# Patient Record
Sex: Female | Born: 1945 | Race: Black or African American | Hispanic: No | Marital: Single | Smoking: Former smoker
Health system: Southern US, Community
[De-identification: ages and names within clinical notes are randomized; demographics above are authoritative.]

---

## 2017-12-25 ENCOUNTER — Emergency Department (HOSPITAL_COMMUNITY): Payer: Medicare Other

## 2017-12-25 ENCOUNTER — Inpatient Hospital Stay (HOSPITAL_COMMUNITY): Payer: Medicare Other

## 2017-12-25 ENCOUNTER — Encounter (HOSPITAL_COMMUNITY): Payer: Self-pay

## 2017-12-25 ENCOUNTER — Inpatient Hospital Stay (HOSPITAL_COMMUNITY)
Admission: EM | Admit: 2017-12-25 | Discharge: 2017-12-28 | DRG: 071 | Disposition: A | Discharge status: Home / Self Care | Payer: Medicare Other | Attending: Internal Medicine | Admitting: Internal Medicine

## 2017-12-25 DIAGNOSIS — N390 Urinary tract infection, site not specified: Secondary | ICD-10-CM | POA: Diagnosis present

## 2017-12-25 DIAGNOSIS — G9349 Other encephalopathy: Principal | ICD-10-CM | POA: Diagnosis present

## 2017-12-25 DIAGNOSIS — R4189 Other symptoms and signs involving cognitive functions and awareness: Secondary | ICD-10-CM | POA: Diagnosis present

## 2017-12-25 DIAGNOSIS — Z87891 Personal history of nicotine dependence: Secondary | ICD-10-CM

## 2017-12-25 DIAGNOSIS — E876 Hypokalemia: Secondary | ICD-10-CM | POA: Diagnosis present

## 2017-12-25 DIAGNOSIS — W19XXXA Unspecified fall, initial encounter: Secondary | ICD-10-CM | POA: Diagnosis present

## 2017-12-25 DIAGNOSIS — R410 Disorientation, unspecified: Secondary | ICD-10-CM

## 2017-12-25 DIAGNOSIS — Z9181 History of falling: Secondary | ICD-10-CM | POA: Diagnosis not present

## 2017-12-25 DIAGNOSIS — Z79891 Long term (current) use of opiate analgesic: Secondary | ICD-10-CM | POA: Diagnosis not present

## 2017-12-25 DIAGNOSIS — R51 Headache: Secondary | ICD-10-CM | POA: Diagnosis present

## 2017-12-25 DIAGNOSIS — G3184 Mild cognitive impairment, so stated: Secondary | ICD-10-CM | POA: Diagnosis not present

## 2017-12-25 DIAGNOSIS — Z791 Long term (current) use of non-steroidal anti-inflammatories (NSAID): Secondary | ICD-10-CM

## 2017-12-25 DIAGNOSIS — G8929 Other chronic pain: Secondary | ICD-10-CM | POA: Diagnosis present

## 2017-12-25 DIAGNOSIS — Z79899 Other long term (current) drug therapy: Secondary | ICD-10-CM

## 2017-12-25 DIAGNOSIS — M549 Dorsalgia, unspecified: Secondary | ICD-10-CM | POA: Diagnosis present

## 2017-12-25 DIAGNOSIS — G35 Multiple sclerosis: Secondary | ICD-10-CM | POA: Diagnosis present

## 2017-12-25 DIAGNOSIS — R4182 Altered mental status, unspecified: Secondary | ICD-10-CM | POA: Diagnosis present

## 2017-12-25 DIAGNOSIS — G35D Multiple sclerosis, unspecified: Secondary | ICD-10-CM | POA: Diagnosis present

## 2017-12-25 LAB — COMPREHENSIVE METABOLIC PANEL
ALT: 17 U/L (ref 14–54)
ANION GAP: 10 (ref 5–15)
AST: 18 U/L (ref 15–41)
Albumin: 4.3 g/dL (ref 3.5–5.0)
Alkaline Phosphatase: 44 U/L (ref 38–126)
BUN: 8 mg/dL (ref 6–20)
CO2: 24 mmol/L (ref 22–32)
Calcium: 9.1 mg/dL (ref 8.9–10.3)
Chloride: 100 mmol/L — ABNORMAL LOW (ref 101–111)
Creatinine, Ser: 0.76 mg/dL (ref 0.44–1.00)
GFR calc non Af Amer: >60 mL/min (ref >60)
Glucose, Bld: 107 mg/dL — ABNORMAL HIGH (ref 65–99)
Potassium: 3.3 mmol/L — ABNORMAL LOW (ref 3.5–5.1)
SODIUM: 134 mmol/L — AB (ref 135–145)
Total Bilirubin: 0.5 mg/dL (ref 0.3–1.2)
Total Protein: 6.9 g/dL (ref 6.5–8.1)

## 2017-12-25 LAB — CBC
HCT: 40.2 % (ref 36.0–46.0)
HEMOGLOBIN: 13.5 g/dL (ref 12.0–15.0)
MCH: 30 pg (ref 26.0–34.0)
MCHC: 33.6 g/dL (ref 30.0–36.0)
MCV: 89.3 fL (ref 78.0–100.0)
Platelets: 143 10*3/uL — ABNORMAL LOW (ref 150–400)
RBC: 4.5 MIL/uL (ref 3.87–5.11)
RDW: 14.1 % (ref 11.5–15.5)
WBC: 5.1 10*3/uL (ref 4.0–10.5)

## 2017-12-25 LAB — URINALYSIS, ROUTINE W REFLEX MICROSCOPIC
Bilirubin Urine: NEGATIVE
Glucose, UA: NEGATIVE mg/dL
Ketones, ur: 5 mg/dL — AB
Nitrite: NEGATIVE
Protein, ur: NEGATIVE mg/dL
SPECIFIC GRAVITY, URINE: 1.024 (ref 1.005–1.030)
pH: 5 (ref 5.0–8.0)

## 2017-12-25 LAB — CBG MONITORING, ED: GLUCOSE-CAPILLARY: 92 mg/dL (ref 65–99)

## 2017-12-25 MED ORDER — ENOXAPARIN SODIUM 40 MG/0.4ML ~~LOC~~ SOLN
40.0000 mg | SUBCUTANEOUS | Status: DC
  Administered 2017-12-25 – 2017-12-27 (×3): 40 mg via SUBCUTANEOUS
  Filled 2017-12-25 (×2): qty 0.4

## 2017-12-25 MED ORDER — POTASSIUM CHLORIDE CRYS ER 20 MEQ PO TBCR
40.0000 meq | EXTENDED_RELEASE_TABLET | Freq: Once | ORAL | Status: AC
  Administered 2017-12-25: 40 meq via ORAL
  Filled 2017-12-25: qty 2

## 2017-12-25 MED ORDER — GADOBENATE DIMEGLUMINE 529 MG/ML IV SOLN
10.0000 mL | Freq: Once | INTRAVENOUS | Status: AC
  Administered 2017-12-25: 10 mL via INTRAVENOUS

## 2017-12-25 MED ORDER — CEPHALEXIN 250 MG PO CAPS
500.0000 mg | ORAL_CAPSULE | Freq: Once | ORAL | Status: AC
  Administered 2017-12-25: 500 mg via ORAL
  Filled 2017-12-25: qty 2

## 2017-12-25 MED ORDER — SODIUM CHLORIDE 0.9 % IV SOLN
1000.0000 mg | Freq: Once | INTRAVENOUS | Status: DC
  Filled 2017-12-25: qty 8

## 2017-12-25 NOTE — ED Notes (Signed)
Pt ambulatory from ED lobby; gait steady. Pt given gown, dressing out at this time. Family at bedside.

## 2017-12-25 NOTE — ED Notes (Signed)
Patient transported to MRI 

## 2017-12-25 NOTE — ED Notes (Signed)
Patient transported to CT 

## 2017-12-25 NOTE — H&P (Signed)
Date: 12/25/2017               Patient Name:  Debra Beltran MRN: [id number]  DOB: August 07, 1946 Age / Sex: 72 y.o., female   PCP: Patient, No Pcp Per         Medical Service: Internal Medicine Teaching Service         Attending Physician: Dr. Sandre Kitty, Elwin Mocha, MD    First Contact: Dr. Alinda Money Pager: [phone]  Second Contact: Dr. Obie Dredge Pager: [phone]       After Hours (After 5p/  First Contact Pager: [phone]  weekends / holidays): Second Contact Pager: [phone]   Chief Complaint: Altered Mental Status, Fall  History of Present Illness: Debra Beltran is a 72 yo F with Hx of Multiple Sclerosis, Chronic Back Pain, and face pain who presents with recent altered mental status and fall. Patient experienced a fall 2 days ago and her family reports she has appeared to have had difficulty with her balance, possibly caused by her walking differently due to chronic back pain. The patient states that she supposes it is true, but she is not sure that she has noticed it herself. Her family additionally reports on going difficulty with word finding at times. Patient is visiting her family from [place]. From history given by family and other providers it was discovered that patient had been seen in [place] (on 12/12/2017) for aphasia (this difficulty finding her words) and her work up their included CBC/BMP without significant abnormality, UA with trace LE, CXR WNL, and MR Brain that showed no acute findings and white matter lesions compatible with her history of Debra. She was discharged with a neurology follow up. She then visited a friend in [place] who reportedly told the patient's daughter that the patient did not seem to be acting like her usual self. When the patient arrived to see her daughter here, the daughter noticed the difficulty finding words and confusion. Patient denies any previous history of falls. She denies any fevers, chills, cough, chest pain, shortness of breath, nauseam increased  urination, or dysuria.  In the ED, patient's vital signs were stable. CBC WNL; CMP showed K 3.3; U/A showed trace leukocytes and rare bacteria (0-5 Squam, suspect contamination). CT head was WNL. Patient received 500mg  PO Keflex. Neurology was consulted in the ED. Patient to be admitted for further workup and care.  Meds:  Current Meds  Medication Sig  . diclofenac (VOLTAREN) 75 MG EC tablet Take 75 mg by mouth daily as needed for pain.  Marland Kitchen gabapentin (NEURONTIN) 300 MG capsule Take 300 mg by mouth 3 (three) times daily.  . Oxcarbazepine (TRILEPTAL) 300 MG tablet Take 300 mg by mouth 2 (two) times daily.  Marland Kitchen oxyCODONE-acetaminophen (PERCOCET) 10-325 MG tablet Take 1 tablet by mouth every 4 (four) hours as needed for pain.     Allergies: Allergies as of 12/25/2017  . (No Known Allergies)   No past medical history on file.  Family History:  Family History  Problem Relation Age of Onset  . Diabetes Father   - Reviewed on admission  Social History:  Social History   Tobacco Use  . Smoking status: Former Smoker    Years: 1.00  . Smokeless tobacco: Former Neurosurgeon    Quit date: 12/26/1967  Substance Use Topics  . Alcohol use: Yes    Alcohol/week: 0.6 - 1.2 oz    Types: 1 - 2 Standard drinks or equivalent per week    Frequency:  Never  . Drug use: Never  - Reviewed on admission  Review of Systems: A complete ROS was negative except as per HPI.  Physical Exam: Blood pressure 133/78, pulse 76, temperature 98.5 F (36.9 C), temperature source Rectal, resp. rate 13, height 5\' 2"  (1.575 m), weight 110 lb (49.9 kg), SpO2 100 %. Physical Exam  Constitutional: She appears well-developed and well-nourished. No distress.  HENT:  Head: Normocephalic and atraumatic.  Cardiovascular: Normal rate, regular rhythm, normal heart sounds and intact distal pulses.  Pulmonary/Chest: Effort normal and breath sounds normal. No respiratory distress.  Abdominal: Soft. Bowel sounds are normal. She  exhibits no distension. There is no tenderness.  Musculoskeletal: She exhibits no edema or deformity.  Neurological: She is alert.  Either not oriented to time or having trouble expressing time Oriented to person, place, situation CN II-XII grossly intact Strength and Sensation grossly intact bilateral upper and lower extremities  Skin: Skin is warm and dry.  Psychiatric: She has a normal mood and affect.   Assessment & Plan by Problem: 72 yo F with Hx of Multiple Sclerosis, Chronic Back Pain, and face pain who presents with recent altered mental status and fall.  Altered mental status Recent Falls: Patient with a history of multiple sclerosis presenting with word finding difficulty and balance issues leading to a recent fall. Symptoms have been ongoing for just a few weeks per patient, however, her family feels they have noticed some change in the last couple of months. In this patient differential includes stroke (unlikely given normal MRI in [place]), Debra Flair (possible, though no acute changes on MRI in [place]); Dementia (patient appear to try to mask her deficits by stating things in different ways, she is also reportedly very private and independent, she may have been able to compensate for smaller deficits if they have been present longer than she states as she is very intelligent and well educated). Less likely would be infectious etiology (normal CXR in [place], no respiratory symptoms; UA showed leuks and bacteria here, but was likely contaminated sample; Afebrile; No Leukocytosis). > Will need to speak with patient further about any - Nuerology consulted in the ED, Appreciate their recommendations - MR Brain - Cognitive screen tomorrow  Hypokalemia: K 3.3 on presentation. - PO - BMP  Chronic Back Pain - Holding home Percocet, Gabapentin, and Voltaren  Facial Pain - Holfing home trileptal  FEN: Regular VTE ppx: Lovenox Code Status: FULL   Dispo: Admit patient to Inpatient  with expected length of stay greater than 2 midnights.  Signed: Beola Cord, MD 12/25/2017, 6:23 PM  Pager: [phone]

## 2017-12-25 NOTE — Consult Note (Addendum)
Neurology Consultation Reason for Consult: Confusion Referring Physician: Dr Rosalia Hammers  History is obtained from: patient, daughter and chart review  HPI: Debra Beltran is a 72 y.o. female with PMH of MS, who is brought by her daughter after falling 2 days and confusion noted by family members.  Patient was diagnosed MS several years ago, however has not had a flare for several years and not on disease modifying therapy.  Patient is a poor historian and denies any symptoms of MS, although daughter states she has trouble with balance.  Patient who lives in [place], was seen by a neurologist recently for aphasia and workup was apparently negative for stroke and demonstrated old MS lesions. Since last few days, family has noted she has been having difficulty speaking on the phone and told her daughter who visited and noticed her mom was not right and brought her to the hospital.  Patient denies any symptoms, however agreed that she had difficulty naming people/objects which is new to her. She is otherwise high functioning and works as a Clinical research associate.     ROS: A 14 point ROS was performed and is negative except as noted in the HPI.   Family History  Problem Relation Age of Onset  . Diabetes Father      Social History:  reports that she has quit smoking. She quit after 1.00 year of use. She quit smokeless tobacco use about 50 years ago. She reports that she drinks about 0.6 - 1.2 oz of alcohol per week. She reports that she does not use drugs.   Exam: Current vital signs: BP 108/61 (BP Location: Right Arm)   Pulse 85   Temp 98.5 F (36.9 C) (Rectal)   Resp 18   Ht 5\' 2"  (1.575 m)   Wt 49.9 kg (110 lb)   SpO2 98%   BMI 20.12 kg/m  Vital signs in last 24 hours: Temp:  [97.8 F (36.6 C)-98.5 F (36.9 C)] 98.5 F (36.9 C) (04/22 1235) Pulse Rate:  [76-90] 85 (04/22 2100) Resp:  [10-19] 18 (04/22 2100) BP: (108-152)/(50-88) 108/61 (04/22 2100) SpO2:  [97 %-100 %] 98 % (04/22 2100) Weight:   [49.9 kg (110 lb)] 49.9 kg (110 lb) (04/22 0824)   Physical Exam  Constitutional: Appears well-developed and well-nourished.  Psych: Affect appropriate to situation Eyes: No scleral injection HENT: No OP obstrucion Head: Normocephalic.  Cardiovascular: Normal rate and regular rhythm.  Respiratory: Effort normal, non-labored breathing GI: Soft.  No distension. There is no tenderness.  Skin: WDI  Neuro: Mental Status: Patient is awake, alert, oriented to person, place, month, year, and situation. Unable to name president, parts of body etc.  Follows commands Cranial Nerves: II: Visual Fields are full. Pupils are equal, round, and reactive to light.  III,IV, VI: EOMI without ptosis or diploplia.  V: Facial sensation is symmetric to temperature VII: Facial movement is symmetric.  VIII: hearing is intact to voice X: Uvula elevates symmetrically XI: Shoulder shrug is symmetric. XII: tongue is midline without atrophy or fasciculations.  Motor: Tone is normal. Bulk is normal. 5/5 strength was present in all four extremities.  Sensory: Sensation is symmetric to light touch and temperature in the arms and legs. Deep Tendon Reflexes: 2+ and symmetric in the biceps and patellae.  Plantars: Toes are downgoing bilaterally.  Cerebellar: FNF and HKS are intact bilaterally      I have reviewed labs in epic and the results pertinent to this consultation are: UA postive for leukocytes, rare bacteria  I have reviewed the images obtained:CT head   ASSESSMENT AND PLAN  48 y F with PMH of MS presents with confusion, nominal aphasia. No other localizing deficits. It would be unusual for her to have MS exacerbation at this age. Will need to r/o stroke, however given recent problems with word finding difficulty, less suspicion fort stroke.    Encephalopathy due to UTI MS dementia ?  Rule out CVA  Plan: MRI brain w/wo contrast to r/o stroke, active MS lesions Treat urinary tract  infection Hold off starting steroids at this time      Georgiana Spinner Shawana Knoch MD [facility] [phone]   If 7pm to 7am, please call on call as listed on AMION.

## 2017-12-25 NOTE — ED Triage Notes (Signed)
PT presents to ED with daughters from home. PT is visiting from [place] and family is concerned that pt has AMS, frequent falls. PT not able to tell this RN the month or year. PT family reports hx of MS

## 2017-12-25 NOTE — ED Notes (Signed)
Pt eating dinner tray provided; family at bedside.

## 2017-12-25 NOTE — ED Provider Notes (Signed)
[facility] EMERGENCY DEPARTMENT Provider Note   CSN: [id number] Arrival date & time: 12/25/17  0802     History   Chief Complaint Chief Complaint  Patient presents with  . Altered Mental Status    HPI Debra Beltran is a 72 y.o. female.  HPI  This is a 72 year old female who presents today with her daughter and daughter-in-law with increasing confusion.  The patient lives in [place] normally independently.  However she has paperwork from workup in [place] where she has had recent workup for aphasia.  She is scheduled to follow-up there with neurology does not appear to have followed up.  During that visit she had an MRI of her brain showed no acute MRI findings.  She does have a history of multiple sclerosis and white matter lesions compatible with her history of multiple sclerosis  No past medical history on file.  There are no active problems to display for this patient.    OB History   None      Home Medications    Prior to Admission medications   Medication Sig Start Date End Date Taking? Authorizing Provider  diclofenac (VOLTAREN) 75 MG EC tablet Take 75 mg by mouth daily as needed for pain. 10/23/17  Yes [provider]  gabapentin (NEURONTIN) 300 MG capsule Take 300 mg by mouth 3 (three) times daily. 11/22/17  Yes [provider]  Oxcarbazepine (TRILEPTAL) 300 MG tablet Take 300 mg by mouth 2 (two) times daily. 10/23/17  Yes [provider]  oxyCODONE-acetaminophen (PERCOCET) 10-325 MG tablet Take 1 tablet by mouth every 4 (four) hours as needed for pain. 10/24/17  Yes [provider]    Family History No family history on file.  Social History Social History   Tobacco Use  . Smoking status: Never Smoker  . Smokeless tobacco: Never Used  Substance Use Topics  . Alcohol use: Never    Frequency: Never  . Drug use: Never     Allergies   Patient has no known allergies.   Review of  Systems Review of Systems   Physical Exam Updated Vital Signs BP 138/73   Pulse 79   Temp 98.5 F (36.9 C) (Rectal)   Resp 13   Ht 1.575 m (5\' 2" )   Wt 49.9 kg (110 lb)   SpO2 100%   BMI 20.12 kg/m   Physical Exam   ED Treatments / Results  Labs (all labs ordered are listed, but only abnormal results are displayed) Labs Reviewed  COMPREHENSIVE METABOLIC PANEL - Abnormal; Notable for the following components:      Result Value   Sodium 134 (*)    Potassium 3.3 (*)    Chloride 100 (*)    Glucose, Bld 107 (*)    All other components within normal limits  CBC - Abnormal; Notable for the following components:   Platelets 143 (*)    All other components within normal limits  URINALYSIS, ROUTINE W REFLEX MICROSCOPIC - Abnormal; Notable for the following components:   APPearance HAZY (*)    Hgb urine dipstick SMALL (*)    Ketones, ur 5 (*)    Leukocytes, UA TRACE (*)    Bacteria, UA RARE (*)    Squamous Epithelial / LPF 0-5 (*)    All other components within normal limits  URINE CULTURE  CBG MONITORING, ED    EKG None  Radiology Ct Head Wo Contrast  Result Date: 12/25/2017 CLINICAL DATA:  Altered level of  consciousness. History of multiple sclerosis. EXAM: CT HEAD WITHOUT CONTRAST TECHNIQUE: Contiguous axial images were obtained from the base of the skull through the vertex without intravenous contrast. COMPARISON:  None. FINDINGS: Brain: No evidence of acute infarction, hemorrhage, hydrocephalus, extra-axial collection or mass lesion/mass effect. Mild to moderate patchy low-density in the cerebral white matter this patient with history of multiple sclerosis. Mild for age cerebral volume loss. Vascular: Atherosclerotic calcification is mild. No hyperdense vessel. Skull: No acute or aggressive finding. Sinuses/Orbits: Negative IMPRESSION: No acute finding. Electronically Signed   By: Marnee Spring M.D.   On: 12/25/2017 14:02    Procedures Procedures (including  critical care time)  Medications Ordered in ED Medications  cephALEXin (KEFLEX) capsule 500 mg (500 mg Oral Given 12/25/17 1450)     Initial Impression / Assessment and Plan / ED Course  I have reviewed the triage vital signs and the nursing notes.  Pertinent labs & imaging results that were available during my care of the patient were reviewed by me and considered in my medical decision making (see chart for details).    72 year old female with a history of MS with increasing confusion and appears to be subacute in nature.  Per daughter, independently in [place] and has continued to Audiological scientist.  However, she has had increased confusion and reported gait abnormality.  Patient has possible urinary tract infection here on labs and was given Keflex there is no focal neurological deficit noted here.  However, she is confused about dates and times repeating her birthdate when asked for the current date and year.  I discussed patient's care with neurology, Dr. Oliva Bustard, who advises that we get today's patient Solu-Medrol given her history of MS. reviewed records brought from visit to ED in [place] on 12/12/2017.  Discharge diagnosis at that time was aphasia.  She had MRI performed that showed no acute lesions but white matter lesions compatible with her history of multiple sclerosis Plan and need for ongoing evaluation and treatment discussed with patient, daughter, and patient's brother. Patient admitted as unassigned medicine patient to internal medicine teaching service.   Final Clinical Impressions(s) / ED Diagnoses   Final diagnoses:  Confusion    ED Discharge Orders    None       Margarita Grizzle, MD 12/25/17 1943

## 2017-12-25 NOTE — ED Notes (Signed)
ORDERED DIET FOR PT  

## 2017-12-26 ENCOUNTER — Other Ambulatory Visit: Payer: Self-pay

## 2017-12-26 DIAGNOSIS — R51 Headache: Secondary | ICD-10-CM

## 2017-12-26 DIAGNOSIS — G35 Multiple sclerosis: Secondary | ICD-10-CM | POA: Diagnosis present

## 2017-12-26 DIAGNOSIS — G8929 Other chronic pain: Secondary | ICD-10-CM

## 2017-12-26 DIAGNOSIS — Z87891 Personal history of nicotine dependence: Secondary | ICD-10-CM

## 2017-12-26 DIAGNOSIS — Z9181 History of falling: Secondary | ICD-10-CM

## 2017-12-26 DIAGNOSIS — R4189 Other symptoms and signs involving cognitive functions and awareness: Secondary | ICD-10-CM | POA: Diagnosis present

## 2017-12-26 DIAGNOSIS — E876 Hypokalemia: Secondary | ICD-10-CM

## 2017-12-26 DIAGNOSIS — Z79891 Long term (current) use of opiate analgesic: Secondary | ICD-10-CM

## 2017-12-26 DIAGNOSIS — Z79899 Other long term (current) drug therapy: Secondary | ICD-10-CM

## 2017-12-26 DIAGNOSIS — M549 Dorsalgia, unspecified: Secondary | ICD-10-CM

## 2017-12-26 DIAGNOSIS — R4182 Altered mental status, unspecified: Secondary | ICD-10-CM

## 2017-12-26 LAB — BASIC METABOLIC PANEL
Anion gap: 6 (ref 5–15)
BUN: 13 mg/dL (ref 6–20)
CHLORIDE: 108 mmol/L (ref 101–111)
CO2: 25 mmol/L (ref 22–32)
Calcium: 9 mg/dL (ref 8.9–10.3)
Creatinine, Ser: 0.83 mg/dL (ref 0.44–1.00)
GFR calc Af Amer: >60 mL/min (ref >60)
GFR calc non Af Amer: >60 mL/min (ref >60)
Glucose, Bld: 92 mg/dL (ref 65–99)
Potassium: 4.1 mmol/L (ref 3.5–5.1)
Sodium: 139 mmol/L (ref 135–145)

## 2017-12-26 LAB — VITAMIN B12: Vitamin B-12: 344 pg/mL (ref 180–914)

## 2017-12-26 LAB — URINE CULTURE: CULTURE: NO GROWTH

## 2017-12-26 LAB — HIV ANTIBODY (ROUTINE TESTING W REFLEX): HIV Screen 4th Generation wRfx: NONREACTIVE

## 2017-12-26 LAB — TSH: TSH: 0.929 u[IU]/mL (ref 0.350–4.500)

## 2017-12-26 MED ORDER — VITAMIN B-1 100 MG PO TABS
100.0000 mg | ORAL_TABLET | Freq: Every day | ORAL | Status: DC
  Administered 2017-12-26 – 2017-12-28 (×3): 100 mg via ORAL
  Filled 2017-12-26 (×3): qty 1

## 2017-12-26 NOTE — Progress Notes (Signed)
   Subjective: Debra Beltran was seen resting comfortable in her bed this morning. She denies any pain or discomfort today and states that she is overall feeling well. She continues to have difficulty with her memory and word finding.  She requests copes of her results before leaving. No complaints or questions this morning.  Objective:  Vital signs in last 24 hours: Vitals:   12/25/17 2230 12/25/17 2306 12/26/17 0008 12/26/17 0501  BP: 109/61 99/79 112/70 (!) 100/58  Pulse: 83 94 79 79  Resp: 18 19 18 18   Temp:   98.2 F (36.8 C) 98.1 F (36.7 C)  TempSrc:   Oral Oral  SpO2: 96% 100% 98% 98%  Weight:      Height:       Physical Exam  Constitutional: She appears well-developed and well-nourished. No distress.  HENT:  Head: Normocephalic and atraumatic.  Cardiovascular: Normal rate, regular rhythm, normal heart sounds and intact distal pulses.  Pulmonary/Chest: Effort normal and breath sounds normal. No respiratory distress.  Abdominal: Soft. Bowel sounds are normal. She exhibits no distension.  Musculoskeletal: She exhibits no edema or deformity.  Neurological: She is alert. No cranial nerve deficit.  Not oriented to time Oriented to person, place, situation CN II-XII grossly intact Strength and Sensation grossly intact bilateral upper and lower extremities  No disdiodochokinesia Grossly normal gait with mild-moderate balance disturbance  Skin: Skin is warm and dry.   Assessment/Plan:  Altered mental status Recent Falls: Patient with a history of multiple sclerosis presented with word finding difficulty, confusion, and balance issues leading to a recent fall. Her symptoms had been ongoing for at least a couple of months per her brother and the patient admitted to significant symptoms for the past few weeks (the patients friend in [place] had her go to the hospital a few weeks ago out of concern). > CT and Mri were negative for acute findings of CVA, Debra, or Debra Beltran. No hydrocephalus.  Mild cerebral atrophy. Also unlikely is infectious etiology (normal CXR in [place], no respiratory symptoms; UA showed leuks and bacteria here, but was likely contaminated sample; Afebrile; No Leukocytosis) > Patients history suspicious for dementia, [facility] 11/30 today. Suspect patient has been able to compensate for any deficits well until recently. When speaking with patient and family it is apparent that patient is very independent and private. She has been working independency for the past 7 or so year and this may have helped any early symptoms go unnoticed. - Nuerology consulted, Appreciate their recommendations - Will ask patient for permission to contact friend/coworker and PCP in [place] to better evaluate symptom onset - Will need to work carefully with patient and her family to arrange safe disposition considering significant deficits on MOCA and Exam  Hypokalemia: K 3.3 on presentation. - Resolved, 4.2 today  Chronic Back Pain - Holding home Percocet, Gabapentin, and Voltaren  Facial Pain - Holfing home trileptal  FEN: Regular VTE ppx: Lovenox Code Status: FULL   Dispo: Anticipated discharge in approximately 1-2 day(s).   Debra Cord, MD 12/26/2017, 3:58 PM Pager: [phone]

## 2017-12-26 NOTE — Consult Note (Cosign Needed)
History obtained from patient and chart review.  HPI: 72 y/o female with PMH of MS. Came to the hospital for confusion after falling for 2 days.  Patient denies symptoms but states " that is not good" when she could not recall numbers during [facility] exam.  Exam: Vitals:   12/26/17 0008 12/26/17 0501  BP: 112/70 (!) 100/58  Pulse: 79 79  Resp: 18 18  Temp: 98.2 F (36.8 C) 98.1 F (36.7 C)  SpO2: 98% 98%    Physical Exam   HEENT-  Normocephalic, no lesions, without obvious abnormality.  Normal external eye and conjunctiva.   Cardiovascular- S1-S2 audible, pulses palpable throughout   Lungs-no rhonchi or wheezing noted, no excessive working breathing.  Saturations within normal limits Abdomen- All 4 quadrants palpated and nontender Extremities- Warm, dry and intact Musculoskeletal-no joint tenderness, deformity or swelling Skin-warm and dry, no hyperpigmentation, vitiligo, or suspicious lesions    Neuro:  Mental Status: Alert, oriented, thought content appropriate.  Speech fluent without evidence of aphasia.  Able to follow 3 step commands without difficulty. Cranial Nerves: II:  Visual fields grossly normal,  III,IV, VI: ptosis not present, extra-ocular motions intact bilaterally pupils equal, round, reactive to light and accommodation V,VII: smile symmetric, facial light touch sensation normal bilaterally VIII: hearing normal bilaterally IX,X: uvula rises symmetrically XI: bilateral shoulder shrug XII: midline tongue extension Motor: Right : Upper extremity   4/5    Left:     Upper extremity   4/5  Lower extremity   4/5     Lower extremity   4/5 Tone and bulk:normal tone throughout; no atrophy noted Sensory: light touch intact throughout, bilaterally Deep Tendon Reflexes: 2+ and symmetric throughout Cerebellar: normal finger-to-nose,  and normal heel-to-shin test Gait: did not assess Adjusted MOCA: 2/8    Medications:    Pertinent Labs/Diagnostics:   Ct Head Wo  Contrast  Result Date: 12/25/2017 CLINICAL DATA:  Altered level of consciousness. History of multiple sclerosis. EXAM: CT HEAD WITHOUT CONTRAST TECHNIQUE: Contiguous axial images were obtained from the base of the skull through the vertex without intravenous contrast. COMPARISON:  None. FINDINGS: Brain: No evidence of acute infarction, hemorrhage, hydrocephalus, extra-axial collection or mass lesion/mass effect. Mild to moderate patchy low-density in the cerebral white matter this patient with history of multiple sclerosis. Mild for age cerebral volume loss. Vascular: Atherosclerotic calcification is mild. No hyperdense vessel. Skull: No acute or aggressive finding. Sinuses/Orbits: Negative IMPRESSION: No acute finding. Electronically Signed   By: Marnee Spring M.D.   On: 12/25/2017 14:02   Mr Laqueta Jean NI Contrast  Result Date: 12/25/2017 CLINICAL DATA:  Initial evaluation for acute altered mental status, history of MS. EXAM: MRI HEAD WITHOUT AND WITH CONTRAST TECHNIQUE: Multiplanar, multiecho pulse sequences of the brain and surrounding structures were obtained without and with intravenous contrast. CONTRAST:  63mL MULTIHANCE GADOBENATE DIMEGLUMINE 529 MG/ML IV SOLN COMPARISON:  Prior CT from earlier the same day. FINDINGS: Brain: Generalized age related cerebral atrophy. Patchy and confluent T2/FLAIR hyperintensity within the periventricular white matter, most like related to provided history of multiple sclerosis. Superimposed chronic T1 hypo intensities, consistent with chronic demyelination. No restricted diffusion or abnormal enhancement to suggest active demyelination. No evidence for acute or subacute ischemia. Gray-white matter differentiation maintained. No other areas of remote cortical infarction. No evidence for acute or chronic intracranial hemorrhage. No mass lesion, midline shift or mass effect. No hydrocephalus. No extra-axial fluid collection. Major dural sinuses are grossly patent.  Pituitary gland suprasellar region normal.  Midline structures intact and normal. Vascular: Major intracranial vascular flow voids are maintained. Skull and upper cervical spine: Craniocervical junction normal. Upper cervical spine normal. Bone marrow signal intensity within normal limits. No scalp soft tissue abnormality. Sinuses/Orbits: Globes normal soft tissues normal. Paranasal sinuses are clear. No mastoid effusion. Inner ear structures normal. Other: None IMPRESSION: 1. No acute intracranial abnormality identified. 2. Moderate cerebral white matter changes involving the periventricular white matter, most consistent with history of multiple sclerosis. No evidence for active demyelination. Electronically Signed   By: Rise Mu M.D.   On: 12/25/2017 20:31   Valentina Lucks, NP-C [facility] [phone]  Impression: 72 y/o F with PMH of MS presented to hospital with confusion, and some aphasia.Patient is having difficulty finding her words.    Recommendations:     12/26/2017, 3:27 PM

## 2017-12-26 NOTE — Progress Notes (Signed)
History obtained from patient and chart review.  HPI: 72 y/o female with PMH of MS. Came to the hospital for confusion after falling for 2 days.  Patient denies symptoms but states " that is not good" when she could not recall numbers during [facility] exam.  Exam: Vitals:   12/26/17 0008 12/26/17 0501  BP: 112/70 (!) 100/58  Pulse: 79 79  Resp: 18 18  Temp: 98.2 F (36.8 C) 98.1 F (36.7 C)  SpO2: 98% 98%    Physical Exam   HEENT-  Normocephalic, no lesions, without obvious abnormality.  Normal external eye and conjunctiva.   Cardiovascular- S1-S2 audible, pulses palpable throughout   Lungs-no rhonchi or wheezing noted, no excessive working breathing.  Saturations within normal limits Abdomen- All 4 quadrants palpated and nontender Extremities- Warm, dry and intact Musculoskeletal-no joint tenderness, deformity or swelling Skin-warm and dry, no hyperpigmentation, vitiligo, or suspicious lesions    Neuro:  Mental Status: Alert, oriented, thought content appropriate.  Speech fluent without evidence of aphasia.  Able to follow 3 step commands without difficulty. Cranial Nerves: II:  Visual fields grossly normal,  III,IV, VI: ptosis not present, extra-ocular motions intact bilaterally pupils equal, round, reactive to light and accommodation V,VII: smile symmetric, facial light touch sensation normal bilaterally VIII: hearing normal bilaterally IX,X: uvula rises symmetrically XI: bilateral shoulder shrug XII: midline tongue extension Motor: Right : Upper extremity   4/5    Left:     Upper extremity   4/5  Lower extremity   4/5     Lower extremity   4/5 Tone and bulk:normal tone throughout; no atrophy noted Sensory: light touch intact throughout, bilaterally Deep Tendon Reflexes: 2+ and symmetric throughout Cerebellar: normal finger-to-nose,  and normal heel-to-shin test Gait: did not assess Adjusted MOCA: 2/8    Medications:    Pertinent Labs/Diagnostics:   Ct Head Wo  Contrast  Result Date: 12/25/2017 CLINICAL DATA:  Altered level of consciousness. History of multiple sclerosis. EXAM: CT HEAD WITHOUT CONTRAST TECHNIQUE: Contiguous axial images were obtained from the base of the skull through the vertex without intravenous contrast. COMPARISON:  None. FINDINGS: Brain: No evidence of acute infarction, hemorrhage, hydrocephalus, extra-axial collection or mass lesion/mass effect. Mild to moderate patchy low-density in the cerebral white matter this patient with history of multiple sclerosis. Mild for age cerebral volume loss. Vascular: Atherosclerotic calcification is mild. No hyperdense vessel. Skull: No acute or aggressive finding. Sinuses/Orbits: Negative IMPRESSION: No acute finding. Electronically Signed   By: Marnee Spring M.D.   On: 12/25/2017 14:02   Mr Laqueta Jean NI Contrast  Result Date: 12/25/2017 CLINICAL DATA:  Initial evaluation for acute altered mental status, history of MS. EXAM: MRI HEAD WITHOUT AND WITH CONTRAST TECHNIQUE: Multiplanar, multiecho pulse sequences of the brain and surrounding structures were obtained without and with intravenous contrast. CONTRAST:  63mL MULTIHANCE GADOBENATE DIMEGLUMINE 529 MG/ML IV SOLN COMPARISON:  Prior CT from earlier the same day. FINDINGS: Brain: Generalized age related cerebral atrophy. Patchy and confluent T2/FLAIR hyperintensity within the periventricular white matter, most like related to provided history of multiple sclerosis. Superimposed chronic T1 hypo intensities, consistent with chronic demyelination. No restricted diffusion or abnormal enhancement to suggest active demyelination. No evidence for acute or subacute ischemia. Gray-white matter differentiation maintained. No other areas of remote cortical infarction. No evidence for acute or chronic intracranial hemorrhage. No mass lesion, midline shift or mass effect. No hydrocephalus. No extra-axial fluid collection. Major dural sinuses are grossly patent.  Pituitary gland suprasellar region normal.  Midline structures intact and normal. Vascular: Major intracranial vascular flow voids are maintained. Skull and upper cervical spine: Craniocervical junction normal. Upper cervical spine normal. Bone marrow signal intensity within normal limits. No scalp soft tissue abnormality. Sinuses/Orbits: Globes normal soft tissues normal. Paranasal sinuses are clear. No mastoid effusion. Inner ear structures normal. Other: None IMPRESSION: 1. No acute intracranial abnormality identified. 2. Moderate cerebral white matter changes involving the periventricular white matter, most consistent with history of multiple sclerosis. No evidence for active demyelination. Electronically Signed   By: Rise Mu M.D.   On: 12/25/2017 20:31   Valentina Lucks, NP-C [facility] [phone]   NEUROHOSPITALIST ADDENDUM Seen and examined the patient today. I have reviewed the contents of history and physical exam as documented by PA/ARNP/Resident and agree with above documentation.  I have discussed and formulated the above plan as documented. Edits to the note have been made as needed.      Impression: 72 y/o F with PMH of MS presented to hospital with confusion and nominal aphasia.  Mri brain shows no acute stroke, old changes suggestive of chronic MS. 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

## 2017-12-26 NOTE — Progress Notes (Signed)
Pt was brought to the floor by this RN. Pt was accompanied by a family member. Pt was assessed to be AxOx3. No complaints of pain at this time. Skin assessment was completed. Vital signs completed. Will continue to monitor.

## 2017-12-27 LAB — RPR: RPR Ser Ql: NONREACTIVE

## 2017-12-27 NOTE — Progress Notes (Signed)
   Subjective: Debra Beltran was seen resting comfortable in her bed this morning. She continues to feel well and continues to experience confusion and difficulty with word finding. She was able to give [place] a number for her neurologist in [place] and state we should be able to reach him today. The plan for her to stay with family while her functioning remains decreased significantly from her baseline was discussed and she seems agreeable to this. No further questions or complaints at this time.  Objective:  Vital signs in last 24 hours: Vitals:   12/26/17 0008 12/26/17 0501 12/26/17 2115 12/27/17 0524  BP: 112/70 (!) 100/58 115/60 101/61  Pulse: 79 79 94 93  Resp: 18 18 18 18   Temp: 98.2 F (36.8 C) 98.1 F (36.7 C) 98.2 F (36.8 C) 98.2 F (36.8 C)  TempSrc: Oral Oral Oral Oral  SpO2: 98% 98% 99% 99%  Weight:      Height:       Physical Exam  Constitutional: She appears well-developed and well-nourished. No distress.  HENT:  Head: Normocephalic and atraumatic.  Cardiovascular: Normal rate, regular rhythm, normal heart sounds and intact distal pulses.  Pulmonary/Chest: Effort normal and breath sounds normal. No respiratory distress.  Abdominal: Soft. Bowel sounds are normal. She exhibits no distension.  Musculoskeletal: She exhibits no edema or deformity.  Neurological: She is alert. No cranial nerve deficit.  Not oriented to time Oriented to person, place, situation  Skin: Skin is warm and dry.   Assessment/Plan:  Altered mental status Recent Falls: Patient with a history of multiple sclerosis presented with word finding difficulty, confusion, and balance issues leading to a recent fall. Her symptoms had been ongoing for at least a couple of months per her brother and the patient admitted to significant symptoms for the past few weeks (the patients friend in [place] had her go to the hospital a few weeks ago out of concern). > CT and Mri were negative for acute findings of CVA,  Debra, or CJD. No hydrocephalus. Mild cerebral atrophy. Also unlikely is infectious etiology (normal CXR in [place], no respiratory symptoms; UA showed leuks and bacteria here, but was likely contaminated sample; Afebrile; No Leukocytosis) > Patients history suspicious for dementia, [facility] 11/30 today. Suspect patient has been able to compensate for any deficits well until recently. When speaking with patient and family it is apparent that patient is very independent and private. 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 > Neurology commented on potential pseudoexacerbation of Debra vs Debra dementia, will work on investigating time course; will contact her [place] nuerologist. - Nuerology consulted, Appreciate their recommendations - Will continue to work with patient and her family to arrange safe disposition considering significant deficits on MOCA and Exam - Will need outpatient neurology follow up and further neurocognitive testing  Chronic Back Pain - Holding home Percocet, Gabapentin, and Voltaren  Facial Pain - [place] home trileptal  FEN: Regular VTE ppx: Lovenox Code Status: FULL   Dispo: Anticipated discharge in approximately 0-1 day(s).   Beola Cord, MD 12/27/2017, 7:49 AM Pager: [phone]

## 2017-12-27 NOTE — Progress Notes (Addendum)
  Date: 12/27/2017  Patient name: Debra Beltran  Medical record number: [id number]  Date of birth: 01-Oct-1945   I have seen and evaluated this patient and I have discussed the plan of care with the house staff. Please see their note for complete details. I concur with their findings with the following additions/corrections:   Exam and evaluation remain most consistent with dementia. I was able to speak with her primary neurologist, Dr. Sandrea Hughs in [place]. He had noticed some mild memory complaints over the past couple years, but nothing as severe as she is demonstrating here. I discussed her MRI findings and workup here, and he did not suggest any other testing at this time. He thought the most likely explanation is dementia, which he has often seen associated with MS, but he found the progression of symptoms surprising. He noted her high level of functioning may have allowed her to mask her symptoms for a long time. He recommended starting donepezil at 5 mg daily, increasing to 10 mg daily after 1 month, and follow up in his clinic.   She continues to show no signs of depression, but complicated grief reaction remains in the differential, possibly exacerbating underlying dementia or MCI. However, undiagnosed dementia seems much more likely.  Will plan to discharge with family supervision for now, may need someone to accompany her back to [facility] to ensure her safety. Will need long-term plan for safe housing.  I personally spent >35 minutes in face-to-face examination and counseling as well as coordination of care.    Jessy Oto, M.D., Ph.D. 12/27/2017, 3:39 PM

## 2017-12-28 DIAGNOSIS — G3184 Mild cognitive impairment, so stated: Secondary | ICD-10-CM

## 2017-12-28 MED ORDER — DONEPEZIL HCL 5 MG PO TABS: 5.0000 mg | ORAL_TABLET | Freq: Every day | ORAL | 0 refills | Status: AC

## 2017-12-28 MED ORDER — DONEPEZIL HCL 5 MG PO TABS: 5.0000 mg | ORAL_TABLET | Freq: Every day | ORAL | Status: DC

## 2017-12-28 MED ORDER — DONEPEZIL HCL 10 MG PO TABS: 10.0000 mg | ORAL_TABLET | Freq: Every day | ORAL | 2 refills | Status: AC

## 2017-12-28 NOTE — Progress Notes (Signed)
Subjective: Debra Beltran seen resting in her bed with daughter at bedside. She was updated on out discussion with her [place] Neurologist and informed she would be discharged today with prescription for donepezil and plan to follow up with neurology. The medication was discussed with her and her questions were answered.  Objective:  Vital signs in last 24 hours: Vitals:   12/27/17 0524 12/27/17 1327 12/27/17 2117 12/28/17 0528  BP: 101/61 99/70 108/60 (!) 99/52  Pulse: 93 98 80 72  Resp: 18 18 18 18   Temp: 98.2 F (36.8 C) 98.2 F (36.8 C) 98.6 F (37 C) 98.1 F (36.7 C)  TempSrc: Oral  Oral Oral  SpO2: 99% 100% 100% 100%  Weight:      Height:       Physical Exam  Constitutional: She appears well-developed and well-nourished. No distress.  HENT:  Head: Normocephalic and atraumatic.  Cardiovascular: Normal rate, regular rhythm, normal heart sounds and intact distal pulses.  Pulmonary/Chest: Effort normal and breath sounds normal. No respiratory distress.  Abdominal: Soft. Bowel sounds are normal. She exhibits no distension.  Musculoskeletal: She exhibits no edema or deformity.  Neurological: She is alert. No cranial nerve deficit.  Not oriented to time Oriented to person, place, situation  Skin: Skin is warm and dry.   Assessment/Plan:  Altered mental status Recent Falls: Patient with a history of multiple sclerosis presented with word finding difficulty, confusion, and balance issues leading to a recent fall. Her symptoms had been ongoing for at least a couple of months per her brother and the patient admitted to significant symptoms for the past few weeks (the patients friend in [place] had her go to the hospital a few weeks ago out of concern). > CT and Mri were negative for acute findings of CVA, Debra, or CJD. No hydrocephalus. Mild cerebral atrophy. Also unlikely is infectious etiology (normal CXR in [place], no respiratory symptoms; UA showed leuks and bacteria here, but was likely  contaminated sample; Afebrile; No Leukocytosis) > Patients history suspicious for dementia, [facility] 11/30 today. Suspect patient has been able to compensate for any deficits well until recently. When speaking with patient and family it is apparent that patient is very independent and private. 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 > Neurology commented on potential pseudoexacerbation of Debra vs Debra dementia, will work on investigating time course; will contact her [place] nuerologist. > Patient Neurologist in [place], Dr. Lacy Duverney, was contacted by phone and updated on the patient's status. He states that he had noticed some mild memory loss and decline and conative function in recent years, but nothing as severe as her symptoms here. Her workup here was discussed and he agreed that the most likely diagnosis is dementia. He state he had seen Debra associated dementia on a number of occasions. He recommended stating donepezil and having her follow up with him in clinic (or a neurologist here is she does not return to [place]).  - Nuerology consulted, Appreciate their recommendations - Patient to discharge today with help from family - Will need outpatient neurology follow up and further neurocognitive testing - Start donepezil on discharge  Chronic Back Pain - Holding home Percocet, Gabapentin, and Voltaren  Facial Pain - [place] home trileptal  FEN: Regular VTE ppx: Lovenox Code Status: FULL   Dispo: Anticipated discharge later today.  Beola Cord, MD 12/28/2017, 11:01 AM Pager: [phone]

## 2017-12-28 NOTE — Discharge Summary (Signed)
Name: Debra Beltran MRN: [id number] DOB: 1945-09-23 72 y.o. PCP: Patient, No Pcp Per  Date of Admission: 12/25/2017  8:17 AM Date of Discharge: 12/28/2017 Attending Physician: Anne Shutter, MD  Discharge Diagnosis:  Principal Problem:   Cognitive impairment Active Problems:   Altered mental status   Multiple sclerosis [facility])   Discharge Medications: Allergies as of 12/28/2017   No Known Allergies     Medication List    TAKE these medications   diclofenac 75 MG EC tablet Commonly known as:  VOLTAREN Take 75 mg by mouth daily as needed for pain.   donepezil 5 MG tablet Commonly known as:  ARICEPT Take 1 tablet (5 mg total) by mouth at bedtime.   donepezil 10 MG tablet Commonly known as:  ARICEPT Take 1 tablet (10 mg total) by mouth at bedtime. Start taking on:  01/27/2018   gabapentin 300 MG capsule Commonly known as:  NEURONTIN Take 300 mg by mouth 3 (three) times daily.   Oxcarbazepine 300 MG tablet Commonly known as:  TRILEPTAL Take 300 mg by mouth 2 (two) times daily.   oxyCODONE-acetaminophen 10-325 MG tablet Commonly known as:  PERCOCET Take 1 tablet by mouth every 4 (four) hours as needed for pain.       Disposition and follow-up:   Debra Beltran was discharged from [facility] in Stable condition.  At the hospital follow up visit please address:  Cognitive Impairment Recent Falls: - Ensure follow up with neurology - Evaluate for further falls - Patient will need further neurocognative testing - Ensure patient able to obtain and take donepezil - Ensure patient continues to have safe home environment with appropriate supervision as needed  2.  Labs / imaging needed at time of follow-up: None  3.  Pending labs/ test needing follow-up: None  Follow-up Appointments:   Hospital Course by problem list:  Cognitive Impairment Recent Falls:Patient with a history of multiple sclerosis presented with word finding  difficulty, confusion, and balance issues leading to a recent fall. Her symptoms had been ongoing for at least a couple of months per her brother and the patient admitted to significant symptoms for the past few weeks (the patients friend in [place] had her go to the hospital a few weeks ago out of concern). CT and MRI were negative for acute findings of CVA, MS, or CJD. No hydrocephalus. Mild cerebral atrophy. Infectious workup was negative. Patients history was suspicious for dementia, [facility] 11/30. Suspect patient had been able to compensate for any deficits well until recently. When speaking with patient and family it was apparent that patient is very independent and private. 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 Neurology saw patient commented on potential pseudoexacerbation of MS vs MS dementia. We were able to speak with patients neurologist in [place] who sees her for her MS, Dr. Lacy Duverney. He stated that he had noticed some mild memory loss and decline and conative function in recent years, but nothing as severe as her symptoms here. Her workup here was discussed and he agreed that the most likely diagnosis is dementia. 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 He recommended starting donepezil and having her follow up with him in clinic (or a neurologist here is she does not return to [place]). Patient was discharge under supervision of family and started on donepezil on discharge.  Discharge Vitals:   BP (!) 99/52 (BP Location: Left Arm)   Pulse 72   Temp 98.1 F (36.7 C) (Oral)   Resp 18   Ht 5\' 2"  (1.575 m)   Wt 110 lb (49.9 kg)   SpO2 100%   BMI 20.12 kg/m   Pertinent Labs, Studies, and Procedures:  CBC Latest Ref Rng & Units 12/25/2017  WBC 4.0 - 10.5 K/uL 5.1  Hemoglobin 12.0 - 15.0 g/dL 68.3  Hematocrit 41.9 - 46.0 % 40.2  Platelets 150 - 400 K/uL 143(L)   BMP  Latest Ref Rng & Units 12/26/2017 12/25/2017  Glucose 65 - 99 mg/dL 92 622(W)  BUN 6 - 20 mg/dL 13 8  Creatinine 9.79 - 1.00 mg/dL 8.92 1.19  Sodium 417 - 145 mmol/L 139 134(L)  Potassium 3.5 - 5.1 mmol/L 4.1 3.3(L)  Chloride 101 - 111 mmol/L 108 100(L)  CO2 22 - 32 mmol/L 25 24  Calcium 8.9 - 10.3 mg/dL 9.0 9.1   CT Head WO: IMPRESSION: No acute finding.  MR Brain W WO: IMPRESSION: 1. No acute intracranial abnormality identified. 2. Moderate cerebral white matter changes involving the periventricular white matter, most consistent with history of multiple sclerosis. No evidence for active demyelination.  Urine Culture: No Growth  TSH: 0.929  HIV: Non Reactive  RPR: Non Reactive  B12: 344 MMA: 16.3   Discharge Instructions: Discharge Instructions    Call MD for:  difficulty breathing, headache or visual disturbances   Complete by:  As directed    Call MD for:  persistant dizziness or light-headedness   Complete by:  As directed    Call MD for:  persistant nausea and vomiting   Complete by:  As directed    Call MD for:  severe uncontrolled pain   Complete by:  As directed    Call MD for:  temperature >100.4   Complete by:  As directed    Diet - low sodium heart healthy   Complete by:  As directed    Discharge instructions   Complete by:  As directed    Thank you for allowing [place] to care for you  Your symptoms of confusion and decreased congnative function are believed to be due to dementia.  - Your Imaging and blood work did not show any acute abnormalities - Dr Lacy Duverney agreed that dementia was the likely diagnosis and he would like you to follow up with him when you are able. - You will need to be monitored over time to see how much of your memory and function returns and evaluate for any other needs - Please start taking Donepezil (Aricept) 5mg  Daily for 1 month, then take 10mg  Daily, continue to take this medication unless instructed otherwise by your  doctor   Medication has been sent to [facility] at [address] [place], [place]  Please follow up with your primary care provider when you are able and inform them of your hospitalization  More information on Dementia can be found at [email or web address] and other organization's websites   Increase activity slowly   Complete by:  As directed       Signed: Beola Cord, MD 12/28/2017, 11:26 AM   Pager: [phone]

## 2017-12-30 LAB — METHYLMALONIC ACID(MMA), RND URINE
Creatinine(Crt), U: 1.48 g/L (ref 0.30–3.00)
MMA - NORMALIZED: 1.2 umol/mmol{creat} (ref 0.5–3.4)
Methylmalonic Acid, Ur: 16.3 umol/L (ref 1.6–29.7)

## 2018-12-10 IMAGING — MR MR HEAD WO/W CM
12 of 14 series · 37 of 48 positions shown · IV contrast (multihance)
Comparison: Prior CT from earlier the same day.

CLINICAL DATA: Initial evaluation for acute altered mental status,
history of MS.

EXAM:
MRI HEAD WITHOUT AND WITH CONTRAST
TECHNIQUE: Multiplanar, multiecho pulse sequences of the brain and surrounding
structures were obtained without and with intravenous contrast.
CONTRAST:  10mL MULTIHANCE GADOBENATE DIMEGLUMINE 529 MG/ML IV SOLN

[Series 5001: ax dwi_tracew · axial · 3.0mm · 1.50mm/px · z∈[-116,+20]mm · 4 of 80 slices shown]
[im 1/80]
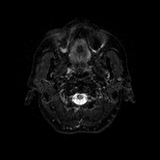
[im 27/80]
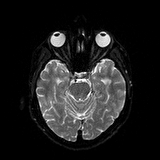
[im 53/80]
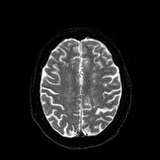
[im 80/80]
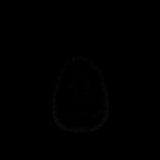

[Series 6001: ax dwi_adc · axial · 3.0mm · 1.50mm/px · z∈[-116,+20]mm · 2 of 40 slices shown]
[im 1/40]
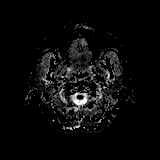
[im 40/40]
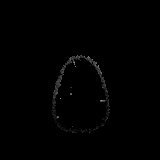

[Series 7001: cor dwi_tracew · coronal · 5.0mm · 1.44mm/px · 4 of 70 slices shown]
[im 1/70]
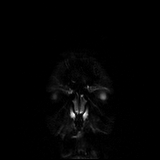
[im 24/70]
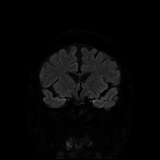
[im 47/70]
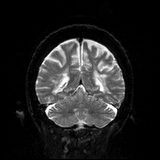
[im 70/70]
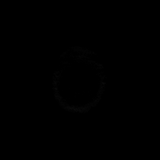

[Series 8001: cor dwi_adc · coronal · 5.0mm · 1.44mm/px · 2 of 35 slices shown]
[im 1/35]
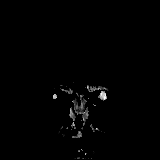
[im 35/35]
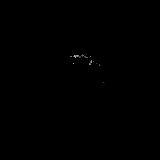

[Series 9001: T1 · sagittal · 5.0mm · 0.75mm/px · 1 of 24 slices shown]
[im 1/24]
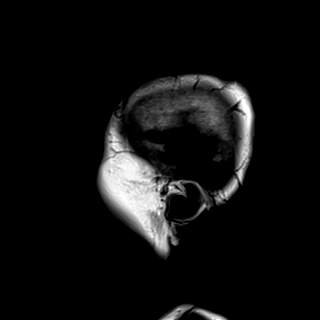

[T2 · axial · 5.0mm · 0.69mm/px · z∈[-113,+26]mm · 2 of 25 slices shown]
[im 1/25]
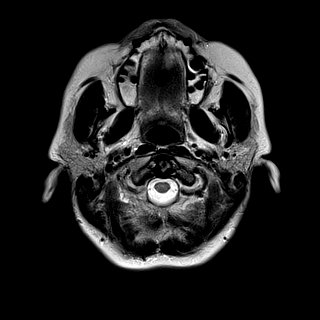
[im 25/25]
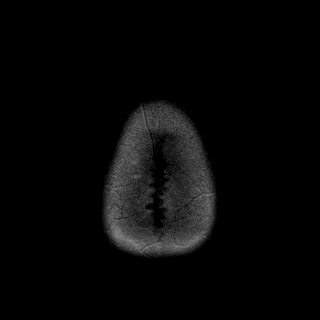

[FLAIR · axial · 5.0mm · 0.43mm/px · z∈[-111,+28]mm · 2 of 25 slices shown]
[im 1/25]
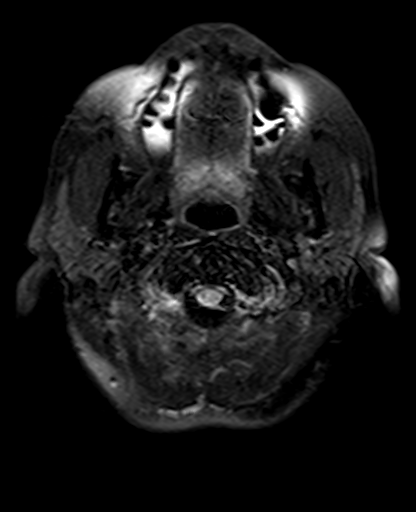
[im 25/25]
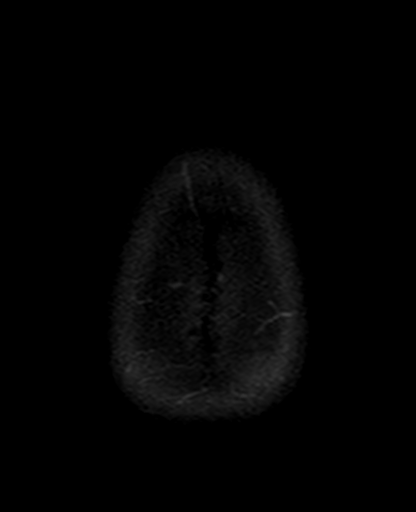

[swi_images · axial · 3.0mm · 0.86mm/px · z∈[-125,+46]mm · 4 of 60 slices shown]
[im 1/60]
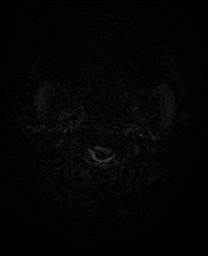
[im 20/60]
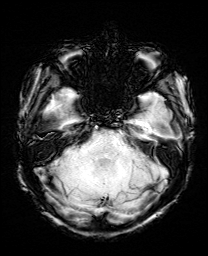
[im 40/60]
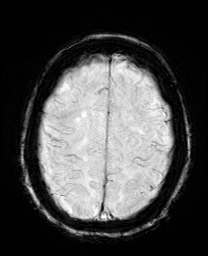
[im 60/60]
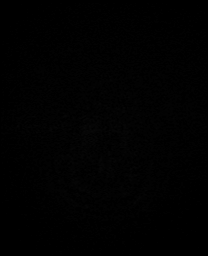

[mip_images(sw) · axial · 24.0mm · 0.86mm/px · z∈[-115,+36]mm · 3 of 53 slices shown]
[im 1/53]
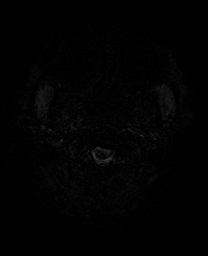
[im 27/53]
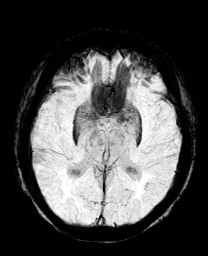
[im 53/53]
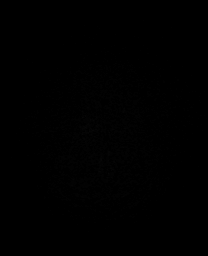

[t2_space_dark-fluid_sag_p2_ns-ir · sagittal · 1.0mm · 0.49mm/px · 9 of 192 slices shown]
[im 1/192]
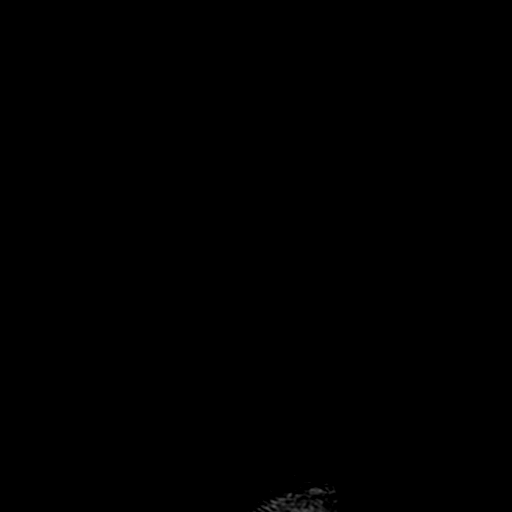
[im 18/192]
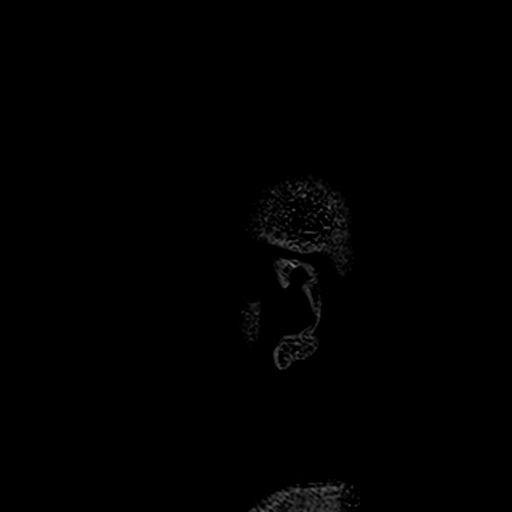
[im 35/192]
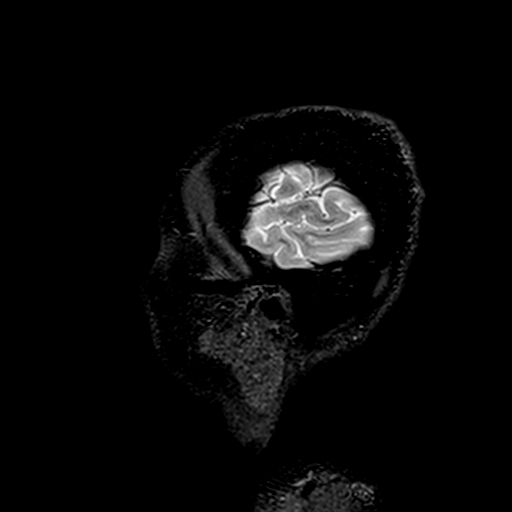
[im 53/192]
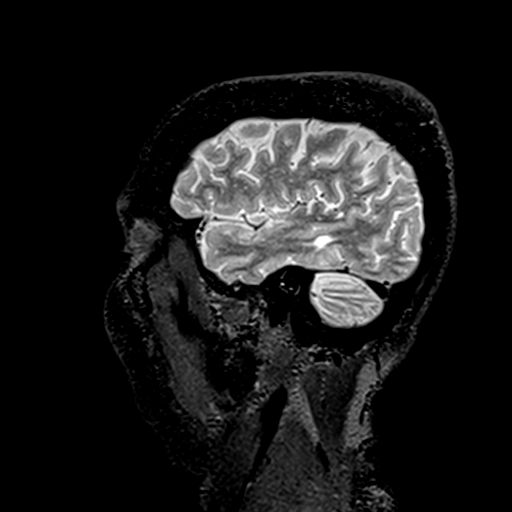
[im 87/192]
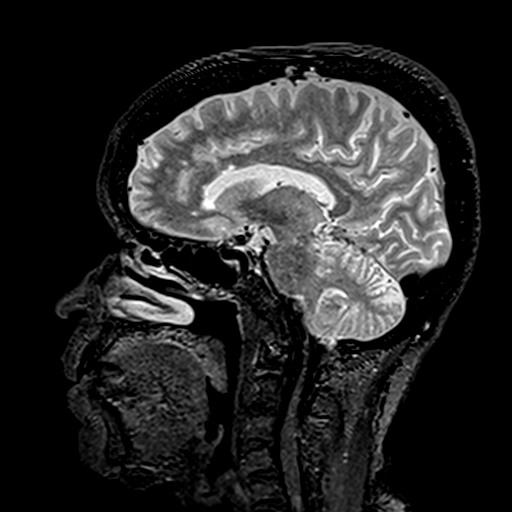
[im 105/192]
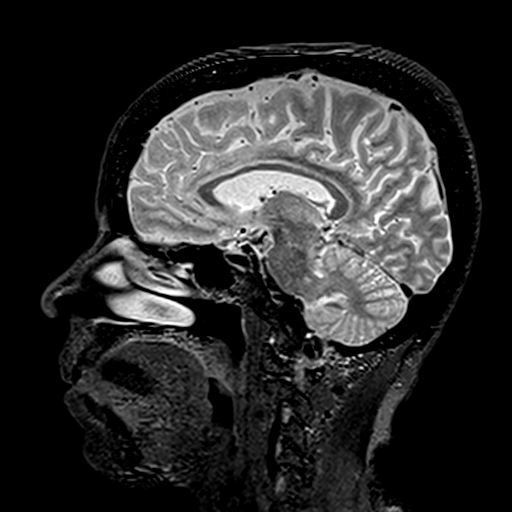
[im 139/192]
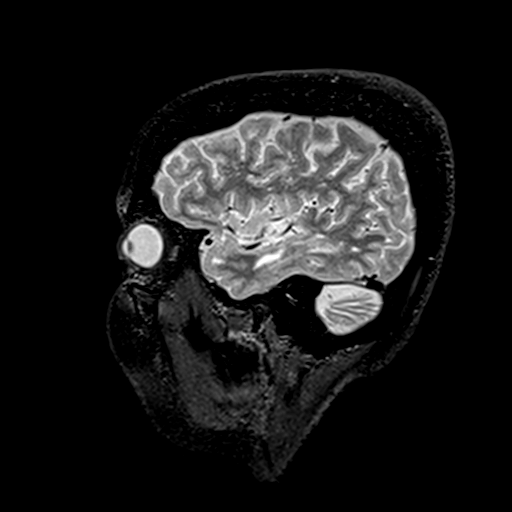
[im 157/192]
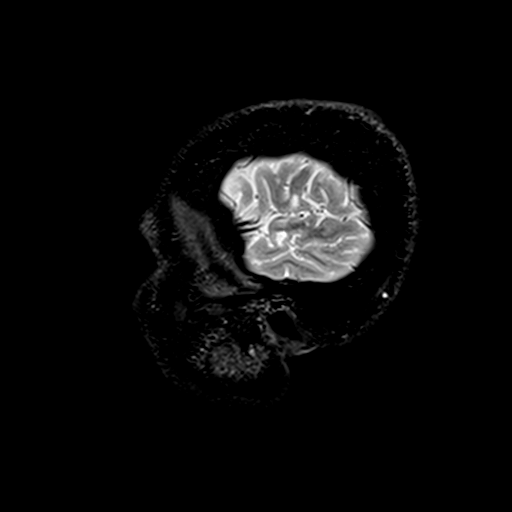
[im 192/192]
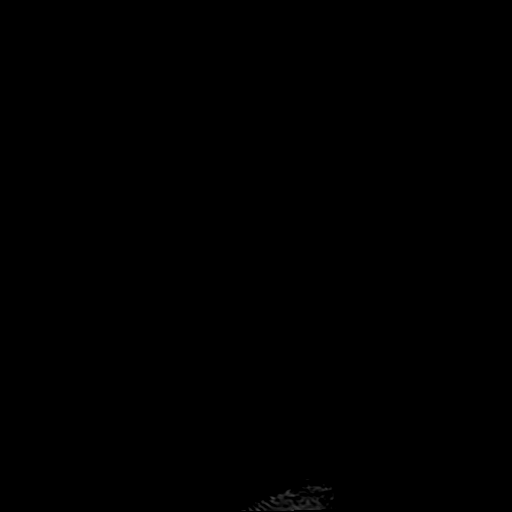

[T2 post-contrast · coronal · 5.0mm · 0.72mm/px · 2 of 28 slices shown]
[im 1/28]
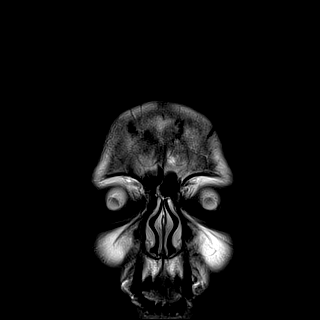
[im 28/28]
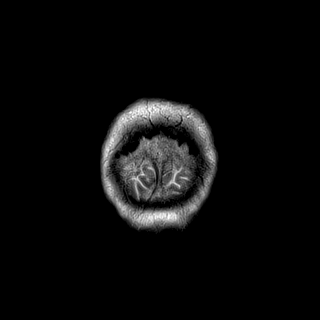

[T1 post-contrast · coronal · 5.0mm · 0.34mm/px · 2 of 28 slices shown]
[im 1/28]
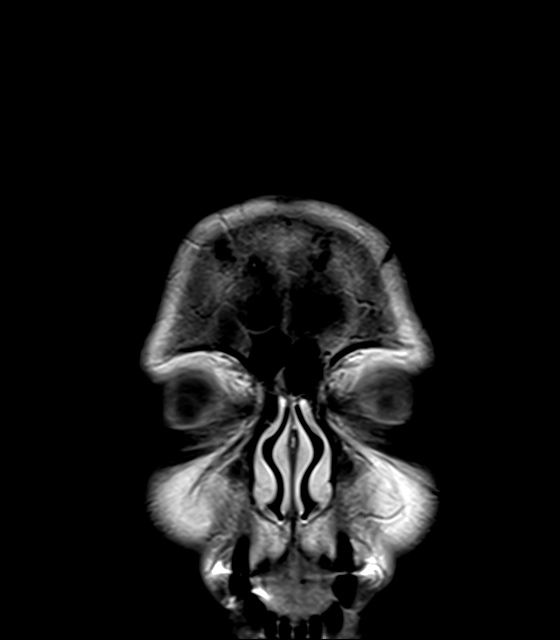
[im 28/28]
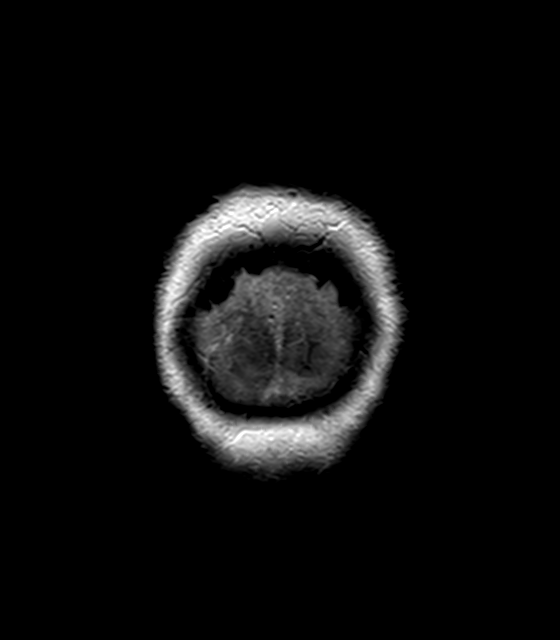

[37 of 48 positions shown; findings below may reference images not displayed]

FINDINGS: Brain: Generalized age related cerebral atrophy. Patchy and
confluent T2/FLAIR hyperintensity within the periventricular white
matter, most like related to provided history of multiple sclerosis.
Superimposed chronic T1 hypo intensities, consistent with chronic
demyelination. No restricted diffusion or abnormal enhancement to
suggest active demyelination.

No evidence for acute or subacute ischemia. Gray-white matter
differentiation maintained. No other areas of remote cortical
infarction. No evidence for acute or chronic intracranial
hemorrhage.

No mass lesion, midline shift or mass effect. No hydrocephalus. No
extra-axial fluid collection. Major dural sinuses are grossly
patent.

Pituitary gland suprasellar region normal. Midline structures intact
and normal.

Vascular: Major intracranial vascular flow voids are maintained.

Skull and upper cervical spine: Craniocervical junction normal.
Upper cervical spine normal. Bone marrow signal intensity within
normal limits. No scalp soft tissue abnormality.

Sinuses/Orbits: Globes normal soft tissues normal. Paranasal sinuses
are clear. No mastoid effusion. Inner ear structures normal.

Other: None
IMPRESSION: 1. No acute intracranial abnormality identified.
2. Moderate cerebral white matter changes involving the
periventricular white matter, most consistent with history of
multiple sclerosis. No evidence for active demyelination.
